# Patient Record
Sex: Male | Born: 1985 | Race: Black or African American | Hispanic: No | Marital: Single
Health system: Southern US, Community
[De-identification: ages and names within clinical notes are randomized; demographics above are authoritative.]

---

## 2020-02-08 ENCOUNTER — Ambulatory Visit (INDEPENDENT_AMBULATORY_CARE_PROVIDER_SITE_OTHER): Payer: 59 | Admitting: Family Medicine

## 2020-02-08 ENCOUNTER — Encounter: Payer: Self-pay | Admitting: Family Medicine

## 2020-02-08 ENCOUNTER — Other Ambulatory Visit: Payer: Self-pay

## 2020-02-08 ENCOUNTER — Ambulatory Visit: Payer: Self-pay

## 2020-02-08 VITALS — BP 120/84 | Ht 67.0 in | Wt 165.0 lb

## 2020-02-08 DIAGNOSIS — M25451 Effusion, right hip: Secondary | ICD-10-CM | POA: Insufficient documentation

## 2020-02-08 DIAGNOSIS — M87059 Idiopathic aseptic necrosis of unspecified femur: Secondary | ICD-10-CM | POA: Insufficient documentation

## 2020-02-08 DIAGNOSIS — M25551 Pain in right hip: Secondary | ICD-10-CM

## 2020-02-08 MED ORDER — PREDNISONE 5 MG PO TABS: ORAL_TABLET | ORAL | 0 refills | Status: AC

## 2020-02-08 NOTE — Patient Instructions (Signed)
Nice to meet you Please try the prednisone  Please try the crutches   Please send me a message in MyChart with any questions or updates.  Please see me back in 1-2 weeks.   --Dr. Jordan Likes

## 2020-02-08 NOTE — Assessment & Plan Note (Signed)
Has severe right hip pain.  Seems more likely related joint being the source as opposed to his back.  Has been ongoing intermittently for the past 6 months or so.  Possible for labral issue.  May have an inflammatory origin but no significant hyperemia on ultrasound today.  Previous x-rays were normal. -Counseled on home exercise therapy and supportive care. -Prednisone. -Crutches. -May need to consider physical therapy or injection or further imaging or lab work.

## 2020-02-08 NOTE — Progress Notes (Signed)
  Jorge Nelson - 35 y.o. male MRN [id number]  Date of birth: 06-28-1985  SUBJECTIVE:  Including CC & ROS.  No chief complaint on file.   Jorge Nelson is a 35 y.o. male that is presenting with acute on chronic right hip pain.  He has pain with any motion or ambulation.  Denies any specific injury.  He reports having various other joints being problematic from time to time.  No previous surgeries.  Seems to be severe.  No fevers or chills.   Review of Systems See HPI   HISTORY: Past Medical, Surgical, Social, and Family History Reviewed & Updated per EMR.   Pertinent Historical Findings include:  History reviewed. No pertinent past medical history.  History reviewed. No pertinent surgical history.  History reviewed. No pertinent family history.  Social History   Socioeconomic History  . Marital status: Single    Spouse name: Not on file  . Number of children: Not on file  . Years of education: Not on file  . Highest education level: Not on file  Occupational History  . Not on file  Tobacco Use  . Smoking status: Not on file  . Smokeless tobacco: Not on file  Substance and Sexual Activity  . Alcohol use: Not on file  . Drug use: Not on file  . Sexual activity: Not on file  Other Topics Concern  . Not on file  Social History Narrative  . Not on file   Social Determinants of Health   Financial Resource Strain: Not on file  Food Insecurity: Not on file  Transportation Needs: Not on file  Physical Activity: Not on file  Stress: Not on file  Social Connections: Not on file  Intimate Partner Violence: Not on file     PHYSICAL EXAM:  VS: BP 120/84   Ht 5\' 7"  (1.702 m)   Wt 165 lb (74.8 kg)   BMI 25.84 kg/m  Physical Exam Gen: NAD, alert, cooperative with exam, well-appearing MSK:  Right hip: Limited internal and external rotation. Limited hip flexion. There is a swelling over the lateral thigh. Neurovascular intact  Limited ultrasound: Right hip:  There  is an effusion of the hip joint. No specific changes of the musculature. Normal-appearing ASIS. No changes at the AIIS.  Summary: Findings would suggest effusion of the hip joint.  Ultrasound and interpretation by , MD    ASSESSMENT & PLAN:   Hip effusion, right Has severe right hip pain.  Seems more likely related joint being the source as opposed to his back.  Has been ongoing intermittently for the past 6 months or so.  Possible for labral issue.  May have an inflammatory origin but no significant hyperemia on ultrasound today.  Previous x-rays were normal. -Counseled on home exercise therapy and supportive care. -Prednisone. -Crutches. -May need to consider physical therapy or injection or further imaging or lab work.

## 2020-02-15 ENCOUNTER — Ambulatory Visit: Payer: Self-pay

## 2020-02-15 ENCOUNTER — Ambulatory Visit (HOSPITAL_BASED_OUTPATIENT_CLINIC_OR_DEPARTMENT_OTHER)
Admission: RE | Admit: 2020-02-15 | Discharge: 2020-02-15 | Disposition: A | Discharge status: Home / Self Care | Payer: 59 | Source: Ambulatory Visit | Attending: Family Medicine | Admitting: Family Medicine

## 2020-02-15 ENCOUNTER — Other Ambulatory Visit: Payer: Self-pay

## 2020-02-15 ENCOUNTER — Ambulatory Visit (INDEPENDENT_AMBULATORY_CARE_PROVIDER_SITE_OTHER): Payer: 59 | Admitting: Family Medicine

## 2020-02-15 ENCOUNTER — Ambulatory Visit: Payer: 59 | Admitting: Family Medicine

## 2020-02-15 VITALS — BP 130/86 | Ht 67.0 in | Wt 165.0 lb

## 2020-02-15 DIAGNOSIS — M25451 Effusion, right hip: Secondary | ICD-10-CM | POA: Insufficient documentation

## 2020-02-15 DIAGNOSIS — M542 Cervicalgia: Secondary | ICD-10-CM | POA: Diagnosis present

## 2020-02-15 MED ORDER — TRIAMCINOLONE ACETONIDE 40 MG/ML IJ SUSP
40.0000 mg | Freq: Once | INTRAMUSCULAR | Status: AC
  Administered 2020-02-15: 40 mg via INTRA_ARTICULAR

## 2020-02-15 NOTE — Patient Instructions (Signed)
Good to see you Please try ice as needed  I will call with the results from today   Please send me a message in MyChart with any questions or updates.  Please see me back in 4 weeks.   --Dr. Skylarr Liz  

## 2020-02-15 NOTE — Assessment & Plan Note (Signed)
Has limited range of motion looking laterally. -Counseled on home exercise therapy and supportive care. -X-ray.

## 2020-02-15 NOTE — Progress Notes (Signed)
Jorge Nelson - 35 y.o. male MRN [id number]  Date of birth: 1985-11-24  SUBJECTIVE:  Including CC & ROS.  No chief complaint on file.   Jorge Nelson is a 35 y.o. male that is presenting with acute worsening of his right hip and gluteus type pain.  I had limited improvement with prednisone for the first couple of days but the pain has returned.  Pain has been ongoing since last April.  He also has pain in other joints.  He has trouble with looking laterally to his right.  No specific injury or inciting event.  He has a nephew that has sickle cell trait.  Unclear if his sickle cell status.   Review of Systems See HPI   HISTORY: Past Medical, Surgical, Social, and Family History Reviewed & Updated per EMR.   Pertinent Historical Findings include:  No past medical history on file.  No past surgical history on file.  No family history on file.  Social History   Socioeconomic History  . Marital status: Single    Spouse name: Not on file  . Number of children: Not on file  . Years of education: Not on file  . Highest education level: Not on file  Occupational History  . Not on file  Tobacco Use  . Smoking status: Not on file  . Smokeless tobacco: Not on file  Substance and Sexual Activity  . Alcohol use: Not on file  . Drug use: Not on file  . Sexual activity: Not on file  Other Topics Concern  . Not on file  Social History Narrative  . Not on file   Social Determinants of Health   Financial Resource Strain: Not on file  Food Insecurity: Not on file  Transportation Needs: Not on file  Physical Activity: Not on file  Stress: Not on file  Social Connections: Not on file  Intimate Partner Violence: Not on file     PHYSICAL EXAM:  VS: BP 130/86   Ht 5\' 7"  (1.702 m)   Wt 165 lb (74.8 kg)   BMI 25.84 kg/m  Physical Exam Gen: NAD, alert, cooperative with exam, well-appearing MSK:  Right hip: Limited internal and external rotation. Limited hip  flexion. Tenderness to palpation at the joint. Neurovascular intact   Aspiration/Injection Procedure Note Thielen Mar 01, 1985  Procedure: Injection Indications: Right hip pain  Procedure Details Consent: Risks of procedure as well as the alternatives and risks of each were explained to the (patient/caregiver).  Consent for procedure obtained. Time Out: Verified patient identification, verified procedure, site/side was marked, verified correct patient position, special equipment/implants available, medications/allergies/relevent history reviewed, required imaging and test results available.  Performed.  The area was cleaned with iodine and alcohol swabs.    The Right hip joint was injected 5 cc of 1% lidocaine on a 22-gauge 3-1/2 inch needle.  The syringe was switched and a mixture containing 1 cc's of 40 mg Kenalog and 4 cc's of 0.25% bupivacaine was injected.  Ultrasound was used. Images were obtained in long views showing the injection.     A sterile dressing was applied.  Patient did tolerate procedure well.     ASSESSMENT & PLAN:   Hip effusion, right Effusion seems to be worse today.  Unclear if this is more inflammatory versus mechanical. -Counseled on home exercise therapy and supportive care. -Injection. -Sickle cell screen, sed rate, CRP, uric acid, and ANA panel, CK -Right hip x-ray and low back x-ray.  Neck pain Has limited range of  motion looking laterally. -Counseled on home exercise therapy and supportive care. -X-ray.

## 2020-02-15 NOTE — Assessment & Plan Note (Signed)
Effusion seems to be worse today.  Unclear if this is more inflammatory versus mechanical. -Counseled on home exercise therapy and supportive care. -Injection. -Sickle cell screen, sed rate, CRP, uric acid, and ANA panel, CK -Right hip x-ray and low back x-ray.

## 2020-02-18 ENCOUNTER — Other Ambulatory Visit: Payer: Self-pay | Admitting: *Deleted

## 2020-02-18 ENCOUNTER — Telehealth: Payer: Self-pay | Admitting: Family Medicine

## 2020-02-18 DIAGNOSIS — M458 Ankylosing spondylitis sacral and sacrococcygeal region: Secondary | ICD-10-CM

## 2020-02-18 NOTE — Telephone Encounter (Signed)
Left VM for patient. If he calls back please have him speak with a nurse/CMA and inform that his xrays and lab work are suggestive of ankylosing spondylitis.  I will place a lab to have confirmation.  We will also place referral to rheumatology..   If any questions then please take the best time and phone number to call and I will try to call him back.   Myra Rude, MD [facility] 02/18/2020, 8:35 AM

## 2020-02-19 ENCOUNTER — Encounter: Payer: Self-pay | Admitting: Family Medicine

## 2020-02-19 LAB — SEDIMENTATION RATE: Sed Rate: 26 mm/hr — ABNORMAL HIGH (ref 0–15)

## 2020-02-19 LAB — ANA,IFA RA DIAG PNL W/RFLX TIT/PATN
ANA Titer 1: NEGATIVE
Cyclic Citrullin Peptide Ab: 9 units (ref 0–19)
Rheumatoid fact SerPl-aCnc: <10 IU/mL (ref <14.0)

## 2020-02-19 LAB — URIC ACID: Uric Acid: 4.9 mg/dL (ref 3.8–8.4)

## 2020-02-19 LAB — SICKLE CELL SCREEN: Sickle Cell Screen: NEGATIVE

## 2020-02-19 LAB — CK: Total CK: 99 U/L (ref 49–439)

## 2020-02-19 LAB — C-REACTIVE PROTEIN: CRP: 46 mg/L — ABNORMAL HIGH (ref 0–10)

## 2020-02-21 ENCOUNTER — Ambulatory Visit: Payer: 59 | Admitting: Family Medicine

## 2020-02-22 ENCOUNTER — Encounter: Payer: Self-pay | Admitting: *Deleted

## 2020-02-22 ENCOUNTER — Other Ambulatory Visit: Payer: Self-pay | Admitting: Family Medicine

## 2020-02-22 DIAGNOSIS — M458 Ankylosing spondylitis sacral and sacrococcygeal region: Secondary | ICD-10-CM

## 2020-02-22 NOTE — Progress Notes (Signed)
Medication Samples have been provided to the patient.  Drug name: rayos       Strength: 5mg         Qty: 2 boxes  LOT: B  Exp.Date: 7/22  Dosing instructions: take one at 10pm and can take 2 only if 1 does not help after several nights  The patient has been instructed regarding the correct time, dose, and frequency of taking this medication, including desired effects and most common side effects.   8/22 8:49 AM 02/22/2020

## 2020-02-22 NOTE — Patient Instructions (Signed)
Samples given per Dr Jordan Likes

## 2020-02-22 NOTE — Progress Notes (Signed)
Evaluate other labs for ongoing inflammatory changes.   Myra Rude, MD [facility] 02/22/2020, 8:18 AM

## 2020-02-23 LAB — COMPREHENSIVE METABOLIC PANEL
ALT: 51 IU/L — ABNORMAL HIGH (ref 0–44)
AST: 25 IU/L (ref 0–40)
Albumin/Globulin Ratio: 1.6 (ref 1.2–2.2)
Albumin: 4.6 g/dL (ref 4.0–5.0)
Alkaline Phosphatase: 139 IU/L — ABNORMAL HIGH (ref 44–121)
BUN/Creatinine Ratio: 14 (ref 9–20)
BUN: 12 mg/dL (ref 6–20)
Bilirubin Total: 0.4 mg/dL (ref 0.0–1.2)
CO2: 23 mmol/L (ref 20–29)
Calcium: 10 mg/dL (ref 8.7–10.2)
Chloride: 99 mmol/L (ref 96–106)
Creatinine, Ser: 0.87 mg/dL (ref 0.76–1.27)
GFR calc Af Amer: 131 mL/min/{1.73_m2} (ref >59)
GFR calc non Af Amer: 113 mL/min/{1.73_m2} (ref >59)
Globulin, Total: 2.9 g/dL (ref 1.5–4.5)
Glucose: 106 mg/dL — ABNORMAL HIGH (ref 65–99)
Potassium: 4.9 mmol/L (ref 3.5–5.2)
Sodium: 138 mmol/L (ref 134–144)
Total Protein: 7.5 g/dL (ref 6.0–8.5)

## 2020-02-23 LAB — CBC
Hematocrit: 49.7 % (ref 37.5–51.0)
Hemoglobin: 16.1 g/dL (ref 13.0–17.7)
MCH: 26.6 pg (ref 26.6–33.0)
MCHC: 32.4 g/dL (ref 31.5–35.7)
MCV: 82 fL (ref 79–97)
Platelets: 424 10*3/uL (ref 150–450)
RBC: 6.06 x10E6/uL — ABNORMAL HIGH (ref 4.14–5.80)
RDW: 14.5 % (ref 11.6–15.4)
WBC: 12.3 10*3/uL — ABNORMAL HIGH (ref 3.4–10.8)

## 2020-02-23 LAB — HEMOGLOBIN A1C
Est. average glucose Bld gHb Est-mCnc: 114 mg/dL
Hgb A1c MFr Bld: 5.6 % (ref 4.8–5.6)

## 2020-02-23 LAB — TSH: TSH: 2.88 u[IU]/mL (ref 0.450–4.500)

## 2020-02-26 ENCOUNTER — Telehealth: Payer: Self-pay | Admitting: Family Medicine

## 2020-02-26 NOTE — Telephone Encounter (Signed)
Left VM for patient. If he calls back please have him speak with a nurse/CMA and inform that his lab work was reassuring. No diabetes. He had one liver enzyme elevated so we will need to check this again in 6 months.   If any questions then please take the best time and phone number to call and I will try to call him back.   Myra Rude, MD [facility] 02/26/2020, 8:53 AM

## 2020-02-26 NOTE — Telephone Encounter (Signed)
Rcvd notice from [facility] -[address],[place] --Appt scheduled w/ Dr. Azucena Fallen Tues--03/04/20 @ 8:30 am.  --Jorge Nelson.  --glh

## 2020-02-28 ENCOUNTER — Encounter: Payer: Self-pay | Admitting: Family Medicine

## 2020-03-05 ENCOUNTER — Other Ambulatory Visit: Payer: Self-pay | Admitting: Family Medicine

## 2020-03-05 ENCOUNTER — Encounter: Payer: Self-pay | Admitting: *Deleted

## 2020-03-05 DIAGNOSIS — M87051 Idiopathic aseptic necrosis of right femur: Secondary | ICD-10-CM

## 2020-03-05 NOTE — Progress Notes (Signed)
Proceed with MRI of right hip due to avascular necrosis on xray and tried conservative treatment and injection.   Myra Rude, MD [facility] 03/05/2020, 8:36 AM

## 2020-03-05 NOTE — Addendum Note (Signed)
Addended by: Merrilyn Puma on: 03/05/2020 10:17 AM   Modules accepted: Orders

## 2020-03-05 NOTE — Addendum Note (Signed)
Addended by: Merrilyn Puma on: 03/05/2020 11:19 AM   Modules accepted: Orders

## 2020-03-06 ENCOUNTER — Encounter: Payer: Self-pay | Admitting: *Deleted

## 2020-03-10 ENCOUNTER — Encounter: Payer: Self-pay | Admitting: *Deleted

## 2020-03-14 ENCOUNTER — Telehealth (INDEPENDENT_AMBULATORY_CARE_PROVIDER_SITE_OTHER): Payer: 59 | Admitting: Family Medicine

## 2020-03-14 ENCOUNTER — Other Ambulatory Visit: Payer: Self-pay

## 2020-03-14 DIAGNOSIS — M454 Ankylosing spondylitis of thoracic region: Secondary | ICD-10-CM | POA: Insufficient documentation

## 2020-03-14 DIAGNOSIS — M458 Ankylosing spondylitis sacral and sacrococcygeal region: Secondary | ICD-10-CM | POA: Diagnosis not present

## 2020-03-14 DIAGNOSIS — M546 Pain in thoracic spine: Secondary | ICD-10-CM | POA: Diagnosis not present

## 2020-03-14 DIAGNOSIS — M542 Cervicalgia: Secondary | ICD-10-CM

## 2020-03-14 NOTE — Assessment & Plan Note (Signed)
Acute on chronic in nature.  May be associated with his underlying inflammatory changes. -Counseled on home exercise therapy and supportive care. -X-ray.

## 2020-03-14 NOTE — Assessment & Plan Note (Signed)
Has been to rheumatology and is on Humira. -May need more samples of Rayos

## 2020-03-14 NOTE — Progress Notes (Signed)
Virtual Visit via Video Note  I connected with Angola Tobler on 03/14/20 at  8:10 AM EST by a video enabled telemedicine application and verified that I am speaking with the correct person using two identifiers.  Location: Patient: home Provider: office   I discussed the limitations of evaluation and management by telemedicine and the availability of in person appointments. The patient expressed understanding and agreed to proceed.  History of Present Illness:  Mr. Scheibel is a 35 year old male that is following up for his right hip.  He has been to the rheumatologist and started on Humira.  He is also having neck and back pain.  These are acute on chronic in nature.   Observations/Objective:  Gen: NAD, alert, cooperative with exam, well-appearing   Assessment and Plan:  Neck pain: Acute on chronic in nature.  May be associated with his underlying inflammatory changes. -Counseled on home exercise therapy and supportive care. -X-ray.  Thoracic back pain: Acute on chronic in nature.  Pain is associated with ankylosing spondylitis. -Counseled on home exercise therapy and supportive care. -X-ray  Ankylosing spondylitis Has been to rheumatology and is on Humira. -May need more samples of Rayos  Follow Up Instructions:    I discussed the assessment and treatment plan with the patient. The patient was provided an opportunity to ask questions and all were answered. The patient agreed with the plan and demonstrated an understanding of the instructions.   The patient was advised to call back or seek an in-person evaluation if the symptoms worsen or if the condition fails to improve as anticipated.   Clare Gandy, MD

## 2020-03-14 NOTE — Assessment & Plan Note (Signed)
Acute on chronic in nature.  Pain is associated with ankylosing spondylitis. -Counseled on home exercise therapy and supportive care. -X-ray

## 2020-03-17 ENCOUNTER — Ambulatory Visit (INDEPENDENT_AMBULATORY_CARE_PROVIDER_SITE_OTHER): Payer: 59 | Admitting: Sports Medicine

## 2020-03-17 ENCOUNTER — Ambulatory Visit (INDEPENDENT_AMBULATORY_CARE_PROVIDER_SITE_OTHER): Payer: 59

## 2020-03-17 ENCOUNTER — Other Ambulatory Visit: Payer: Self-pay

## 2020-03-17 DIAGNOSIS — M87051 Idiopathic aseptic necrosis of right femur: Secondary | ICD-10-CM

## 2020-03-17 DIAGNOSIS — M169 Osteoarthritis of hip, unspecified: Secondary | ICD-10-CM | POA: Diagnosis not present

## 2020-03-17 DIAGNOSIS — M2469 Ankylosis, other specified joint: Secondary | ICD-10-CM | POA: Diagnosis not present

## 2020-03-17 MED ORDER — TRAMADOL HCL 50 MG PO TABS: 50.0000 mg | ORAL_TABLET | Freq: Three times a day (TID) | ORAL | 0 refills | Status: DC | PRN

## 2020-03-17 MED ORDER — GADOBUTROL 1 MMOL/ML IV SOLN
1.0000 mL | Freq: Once | INTRAVENOUS | Status: AC | PRN
  Administered 2020-03-17: 1 mL via INTRAVENOUS

## 2020-03-17 NOTE — Assessment & Plan Note (Signed)
This is a very pleasant 35 year old male with history of ankylosing spondylitis, he has had a long history of severe right hip pain, seen by Dr. Jordan Likes and diagnosed with hip OA and possible avascular necrosis. No strong history of steroid use, alcohol use, hip trauma, or smoking. He is referred to me for hip arthrogram, we performed an arthrogram injection today, adding some tramadol for his pain, I did explain to him that he is really going to need a hip replacement.

## 2020-03-17 NOTE — Progress Notes (Signed)
    Procedures performed today:    Procedure: Real-time Ultrasound Guided gadolinium contrast injection of right hip joint Device: Samsung HS60  Verbal informed consent obtained.  Time-out conducted.  Noted no overlying erythema, induration, or other signs of local infection.  Skin prepped in a sterile fashion.  Local anesthesia: Topical Ethyl chloride.  With sterile technique and under real time ultrasound guidance: Noted dysplastic and arthritic appearing hip joint, 22-gauge spinal needle contacted femoral head/neck junction, I injected 1 cc kenalog 40, 2 cc lidocaine, 2 cc bupivacaine, syringe switched and 0.1 cc gadolinium injected, syringe again switched and 15 cc sterile saline used to fully distend the joint. Joint visualized and capsule seen distending confirming intra-articular placement of contrast material and medication. Completed without difficulty  Advised to call if fevers/chills, erythema, induration, drainage, or persistent bleeding.  Images permanently stored in PACS Impression: Technically successful ultrasound guided gadolinium contrast injection for MR arthrography.  Please see separate MR arthrogram report.   Independent interpretation of notes and tests performed by another provider:   None.  Brief History, Exam, Impression, and Recommendations:    Avascular necrosis of bone of hip (HCC) This is a very pleasant 35 year old male with history of ankylosing spondylitis, he has had a long history of severe right hip pain, seen by Dr. Jordan Likes and diagnosed with hip OA and possible avascular necrosis. No strong history of steroid use, alcohol use, hip trauma, or smoking. He is referred to me for hip arthrogram, we performed an arthrogram injection today, adding some tramadol for his pain, I did explain to him that he is really going to need a hip replacement.     ___________________________________________ Jorge Nelson. Jorge Nelson, M.D., ABFM., CAQSM. Primary Care  and Sports Medicine [facility]  Adjunct Instructor of Family Medicine  [employer]

## 2020-03-18 ENCOUNTER — Encounter: Payer: Self-pay | Admitting: Family Medicine

## 2020-03-19 NOTE — Telephone Encounter (Signed)
Routing to primary treating provider.  My role is just to do the injection here.

## 2020-03-20 ENCOUNTER — Telehealth (INDEPENDENT_AMBULATORY_CARE_PROVIDER_SITE_OTHER): Payer: 59 | Admitting: Family Medicine

## 2020-03-20 ENCOUNTER — Other Ambulatory Visit: Payer: Self-pay

## 2020-03-20 ENCOUNTER — Encounter: Payer: Self-pay | Admitting: *Deleted

## 2020-03-20 DIAGNOSIS — M87051 Idiopathic aseptic necrosis of right femur: Secondary | ICD-10-CM

## 2020-03-20 NOTE — Progress Notes (Signed)
Virtual Visit via Video Note  I connected with Angola Rudy on 03/20/20 at  8:00 AM EST by a video enabled telemedicine application and verified that I am speaking with the correct person using two identifiers.  Location: Patient: home Provider: office   I discussed the limitations of evaluation and management by telemedicine and the availability of in person appointments. The patient expressed understanding and agreed to proceed.  History of Present Illness:  Mr. Stepanek is a 35 year old male is following up after the MRI of his right hip.  It was demonstrating advanced joint arthritis as well as labral degeneration.  Has impingement as well as synovitis.   Observations/Objective:  Gen: NAD, alert, cooperative with exam, well-appearing  Assessment and Plan:  Avascular necrosis of right hip joint. MRI was confirming cellulitis as well as significant degenerative changes of the labrum and the joint. -Counseled on home exercise therapy and supportive care. -Referral to orthopedic surgery with Dr. Caswell Corwin  Follow Up Instructions:    I discussed the assessment and treatment plan with the patient. The patient was provided an opportunity to ask questions and all were answered. The patient agreed with the plan and demonstrated an understanding of the instructions.   The patient was advised to call back or seek an in-person evaluation if the symptoms worsen or if the condition fails to improve as anticipated.    Clare Gandy, MD

## 2020-03-20 NOTE — Assessment & Plan Note (Signed)
MRI was confirming cellulitis as well as significant degenerative changes of the labrum and the joint. -Counseled on home exercise therapy and supportive care. -Referral to orthopedic surgery with Dr. Caswell Corwin

## 2020-03-24 ENCOUNTER — Ambulatory Visit (HOSPITAL_BASED_OUTPATIENT_CLINIC_OR_DEPARTMENT_OTHER)
Admission: RE | Admit: 2020-03-24 | Discharge: 2020-03-24 | Disposition: A | Discharge status: Home / Self Care | Payer: 59 | Source: Ambulatory Visit | Attending: Family Medicine | Admitting: Family Medicine

## 2020-03-24 ENCOUNTER — Other Ambulatory Visit: Payer: Self-pay

## 2020-03-24 ENCOUNTER — Other Ambulatory Visit: Payer: Self-pay | Admitting: Family Medicine

## 2020-03-24 DIAGNOSIS — M546 Pain in thoracic spine: Secondary | ICD-10-CM | POA: Diagnosis present

## 2020-03-24 DIAGNOSIS — M542 Cervicalgia: Secondary | ICD-10-CM

## 2020-03-24 DIAGNOSIS — M453 Ankylosing spondylitis of cervicothoracic region: Secondary | ICD-10-CM

## 2020-03-24 DIAGNOSIS — M458 Ankylosing spondylitis sacral and sacrococcygeal region: Secondary | ICD-10-CM | POA: Diagnosis present

## 2020-03-26 ENCOUNTER — Telehealth: Payer: Self-pay | Admitting: Family Medicine

## 2020-03-26 DIAGNOSIS — M542 Cervicalgia: Secondary | ICD-10-CM

## 2020-03-26 DIAGNOSIS — M458 Ankylosing spondylitis sacral and sacrococcygeal region: Secondary | ICD-10-CM

## 2020-03-26 DIAGNOSIS — M87051 Idiopathic aseptic necrosis of right femur: Secondary | ICD-10-CM

## 2020-03-26 DIAGNOSIS — M546 Pain in thoracic spine: Secondary | ICD-10-CM

## 2020-03-26 NOTE — Telephone Encounter (Signed)
Left VM for patient. If he calls back please have him speak with a nurse/CMA and inform that his xray is showing loss the height of the bone in his back. We may need to check his vitamin d going forward. If the pain in the back and neck are still ongoing despite treatment for his inflammatory condition, we may need to pursue an MRI in each of these areas.    If any questions then please take the best time and phone number to call and I will try to call him back.   Myra Rude, MD [facility] 03/26/2020, 8:17 AM

## 2020-03-26 NOTE — Addendum Note (Signed)
Addended by: Merrilyn Puma on: 03/26/2020 09:17 AM   Modules accepted: Orders

## 2020-03-26 NOTE — Telephone Encounter (Signed)
Per Dr. Jordan Likes MRI orders placed and [facility] referral entered.

## 2020-03-26 NOTE — Telephone Encounter (Signed)
Pt informed of below. He would like to proceed with MRIs for neck and back.   He states [facility] is in network with his insurance.  See attached referral for his hip.

## 2020-03-28 ENCOUNTER — Other Ambulatory Visit: Payer: 59

## 2020-03-31 ENCOUNTER — Ambulatory Visit (INDEPENDENT_AMBULATORY_CARE_PROVIDER_SITE_OTHER): Payer: 59 | Admitting: Orthopaedic Surgery

## 2020-03-31 VITALS — Ht 67.0 in | Wt 165.0 lb

## 2020-03-31 DIAGNOSIS — M1611 Unilateral primary osteoarthritis, right hip: Secondary | ICD-10-CM

## 2020-03-31 NOTE — Progress Notes (Signed)
Office Visit Note   Patient: Jorge Nelson           Date of Birth: Oct 15, 1985           MRN: [id number] Visit Date: 03/31/2020              Requested by: Myra Rude, MD [address]  [place] [address] PCP: Patient, No Pcp Per   Assessment & Plan: Visit Diagnoses:  1. Unilateral primary osteoarthritis, right hip     Plan: I went over the patient's x-rays with him in detail.  He has severe end-stage arthritis of his right hip.  I have recommended hip replacement for his right hip.  There is really no other treatment measures that can help with this standpoint.  Even an intra-articular steroid injection would only help temporize the pain for a small amount of time.  He is already tried steroids.  I recommended he offload that hip with at least a cane.  I showed him a hip model explained in detail what hip replacement surgery involves.  We discussed the risks and benefits of this type of surgery as well as what to expect through an intraoperative and postoperative course.  I can my hand about hip replacement surgery as well.  All questions and concerns were answered and addressed.  He is interested in having this scheduled.  We will be in touch.  Follow-Up Instructions: Return for 2 weeks post-op.   Orders:  No orders of the defined types were placed in this encounter.  No orders of the defined types were placed in this encounter.     Procedures: No procedures performed   Clinical Data: No additional findings.   Subjective: Chief Complaint  Patient presents with  . Right Hip - Pain  The patient comes in today with over a year of worsening right hip pain and stiffness.  He has x-rays and a MRI of his right hip on the canopy system for me to review.  He does walk with a limp he says is painful to walk with pain in the groin on the right side and pain with weightbearing and motion of the right hip.  He says it is harder to put his shoes and socks on  on the right side as well.  He denies any injuries.  He is a thin individual.  He has no active medical issues other than he says chronic low back pain and a stiff back.  I believe there is potential for diagnosis of ankylosing spondylitis from reviewing his chart.  He is worked on activity modification and taken anti-inflammatories.  Has been on steroids as well.  All of that his only minimally decreased his right hip pain.  HPI  Review of Systems He currently denies any headache, chest pain, short of breath, fever, chills, nausea, vomiting  Objective: Vital Signs: Ht 5\' 7"  (1.702 m)   Wt 165 lb (74.8 kg)   BMI 25.84 kg/m   Physical Exam He is alert and orient x3 and in no acute distress.  He walks with a significant limp and does show stiffness in his low back and his right hip. Ortho Exam Examination of his left hip is normal.  Examination of his right hip shows severe stiffness with internal and external rotation as well as significant pain in the groin with attempts of rotation of the right hip. Specialty Comments:  No specialty comments available.  Imaging: No results found. Both plain  films and the MRI of his right hip show severe end-stage arthritis.  There is no evidence of avascular process or fracture but there is flattening of the femoral head.  There is reactive edema in the acetabulum.  There are periarticular osteophytes and a large area denuding of the cartilage over the weightbearing surface of his right hip.  PMFS History: Patient Active Problem List   Diagnosis Date Noted  . Unilateral primary osteoarthritis, right hip 03/31/2020  . Ankylosing spondylitis of sacral region (HCC) 03/14/2020  . Acute midline thoracic back pain 03/14/2020  . Neck pain 02/15/2020  . Avascular necrosis of bone of hip (HCC) 02/08/2020   No past medical history on file.  No family history on file.  No past surgical history on file. Social History   Occupational History  . Not on file   Tobacco Use  . Smoking status: Not on file  . Smokeless tobacco: Not on file  Substance and Sexual Activity  . Alcohol use: Not on file  . Drug use: Not on file  . Sexual activity: Not on file

## 2020-04-01 ENCOUNTER — Encounter: Payer: Self-pay | Admitting: *Deleted

## 2020-04-06 ENCOUNTER — Other Ambulatory Visit: Payer: Self-pay

## 2020-04-06 ENCOUNTER — Ambulatory Visit (INDEPENDENT_AMBULATORY_CARE_PROVIDER_SITE_OTHER): Payer: 59

## 2020-04-06 DIAGNOSIS — M458 Ankylosing spondylitis sacral and sacrococcygeal region: Secondary | ICD-10-CM

## 2020-04-06 DIAGNOSIS — M546 Pain in thoracic spine: Secondary | ICD-10-CM

## 2020-04-06 DIAGNOSIS — M542 Cervicalgia: Secondary | ICD-10-CM | POA: Diagnosis not present

## 2020-04-06 DIAGNOSIS — M549 Dorsalgia, unspecified: Secondary | ICD-10-CM

## 2020-04-06 DIAGNOSIS — M25512 Pain in left shoulder: Secondary | ICD-10-CM

## 2020-04-06 DIAGNOSIS — M25511 Pain in right shoulder: Secondary | ICD-10-CM

## 2020-04-09 ENCOUNTER — Other Ambulatory Visit: Payer: Self-pay

## 2020-04-09 DIAGNOSIS — M87051 Idiopathic aseptic necrosis of right femur: Secondary | ICD-10-CM

## 2020-04-10 ENCOUNTER — Encounter: Payer: Self-pay | Admitting: Family Medicine

## 2020-04-10 ENCOUNTER — Encounter: Payer: Self-pay | Admitting: Orthopaedic Surgery

## 2020-04-10 ENCOUNTER — Other Ambulatory Visit: Payer: Self-pay | Admitting: Physician Assistant

## 2020-04-10 DIAGNOSIS — M87051 Idiopathic aseptic necrosis of right femur: Secondary | ICD-10-CM

## 2020-04-10 MED ORDER — TRAMADOL HCL 50 MG PO TABS: 50.0000 mg | ORAL_TABLET | Freq: Three times a day (TID) | ORAL | 0 refills | Status: DC | PRN

## 2020-04-11 ENCOUNTER — Telehealth (INDEPENDENT_AMBULATORY_CARE_PROVIDER_SITE_OTHER): Payer: 59 | Admitting: Family Medicine

## 2020-04-11 ENCOUNTER — Other Ambulatory Visit: Payer: Self-pay

## 2020-04-11 DIAGNOSIS — M454 Ankylosing spondylitis of thoracic region: Secondary | ICD-10-CM

## 2020-04-11 DIAGNOSIS — M87051 Idiopathic aseptic necrosis of right femur: Secondary | ICD-10-CM | POA: Diagnosis not present

## 2020-04-11 MED ORDER — CELECOXIB 200 MG PO CAPS: ORAL_CAPSULE | ORAL | 2 refills | Status: AC

## 2020-04-11 MED ORDER — TRAMADOL HCL 50 MG PO TABS: 50.0000 mg | ORAL_TABLET | Freq: Three times a day (TID) | ORAL | 0 refills | Status: DC | PRN

## 2020-04-11 NOTE — Assessment & Plan Note (Signed)
Has met with surgeon and is expected to have surgery in the next few months.  He does get improvement with tramadol for his pain. -Counseled on home exercise therapy and supportive care. -Tramadol.

## 2020-04-11 NOTE — Progress Notes (Signed)
Virtual Visit via Video Note  I connected with Jorge Nelson on 04/11/20 at  8:10 AM EDT by a video enabled telemedicine application and verified that I am speaking with the correct person using two identifiers.  Location: Patient: home Provider: office   I discussed the limitations of evaluation and management by telemedicine and the availability of in person appointments. The patient expressed understanding and agreed to proceed.  History of Present Illness:  Jorge Nelson is a 35 yo male that is following up after the MRI of his thoracic spine and cervical spine.  This was demonstrating ankylosing thoracic spine with marrow edema related to the inflammatory disease.   Observations/Objective:  Gen: NAD, alert, cooperative with exam, well-appearing   Assessment and Plan:  Avascular necrosis of right hip: Has met with surgeon and is expected to have surgery in the next few months.  He does get improvement with tramadol for his pain. -Counseled on home exercise therapy and supportive care. -Tramadol.  Ankylosing spondylolysis of thoracic region: Has changes on MRI consistent with ankylosing.  No nerve impingement or spinal stenosis. -Counseled on home exercise therapy and supportive care. -Celebrex  Follow Up Instructions:    I discussed the assessment and treatment plan with the patient. The patient was provided an opportunity to ask questions and all were answered. The patient agreed with the plan and demonstrated an understanding of the instructions.   The patient was advised to call back or seek an in-person evaluation if the symptoms worsen or if the condition fails to improve as anticipated.    Clearance Coots, MD

## 2020-04-11 NOTE — Assessment & Plan Note (Signed)
Has changes on MRI consistent with ankylosing.  No nerve impingement or spinal stenosis. -Counseled on home exercise therapy and supportive care. -Celebrex

## 2020-05-16 ENCOUNTER — Encounter: Payer: Self-pay | Admitting: Family Medicine

## 2020-05-20 ENCOUNTER — Other Ambulatory Visit: Payer: Self-pay | Admitting: Family Medicine

## 2020-05-21 MED ORDER — TRAMADOL HCL 50 MG PO TABS: 50.0000 mg | ORAL_TABLET | Freq: Three times a day (TID) | ORAL | 0 refills | Status: AC | PRN

## 2020-05-29 ENCOUNTER — Encounter: Payer: Self-pay | Admitting: Orthopaedic Surgery

## 2020-05-29 NOTE — Telephone Encounter (Signed)
Please call pt back.  Thank you!

## 2020-06-20 ENCOUNTER — Other Ambulatory Visit: Payer: Self-pay

## 2020-06-26 ENCOUNTER — Telehealth: Payer: Self-pay

## 2020-06-26 NOTE — Telephone Encounter (Signed)
Patient called and left voice mail stating he is moving to [place].  Cancelled [facility] for 07/15/20.  He will follow up with ortho there and have surgery.

## 2020-07-17 ENCOUNTER — Telehealth: Payer: Self-pay | Admitting: Orthopaedic Surgery

## 2020-07-17 NOTE — Telephone Encounter (Signed)
Received vm from pt, needs records as he has moved to [place]. IC,lmvm advised need release form filled out, advised we can send to him,advised for him to call us back to let us know if to mail to him or email to him and we will get form to him so we can process his request.

## 2020-07-21 ENCOUNTER — Telehealth: Payer: Self-pay | Admitting: Physician Assistant

## 2020-07-21 ENCOUNTER — Other Ambulatory Visit: Payer: Self-pay | Admitting: Family Medicine

## 2020-07-21 NOTE — Telephone Encounter (Signed)
Received vm from pt with email to send release form, emailed to him [email or web address]

## 2020-07-29 ENCOUNTER — Encounter: Payer: 59 | Admitting: Orthopaedic Surgery

## 2021-10-21 IMAGING — DX DG HIP (WITH OR WITHOUT PELVIS) 2-3V*R*
3 series · 3 of 3 positions shown · non-contrast
Comparison: None.
COMPARISON: None.

Addendum:
CLINICAL DATA: Chronic right hip pain.

EXAM:
DG HIP (WITH OR WITHOUT PELVIS) 2-3V RIGHT

[pelvis ap]
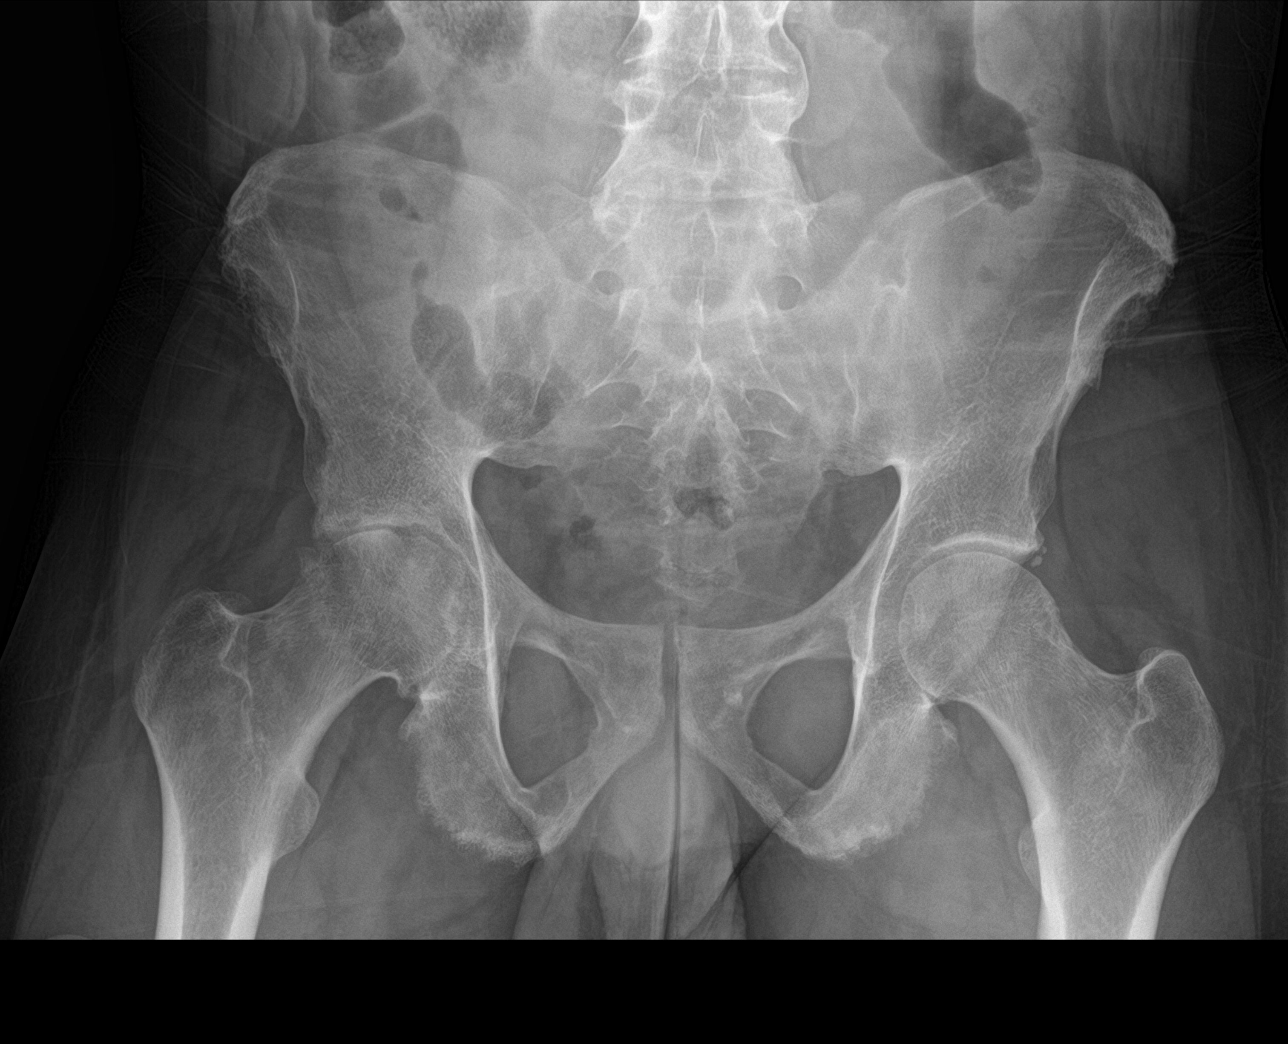

[hip ap]
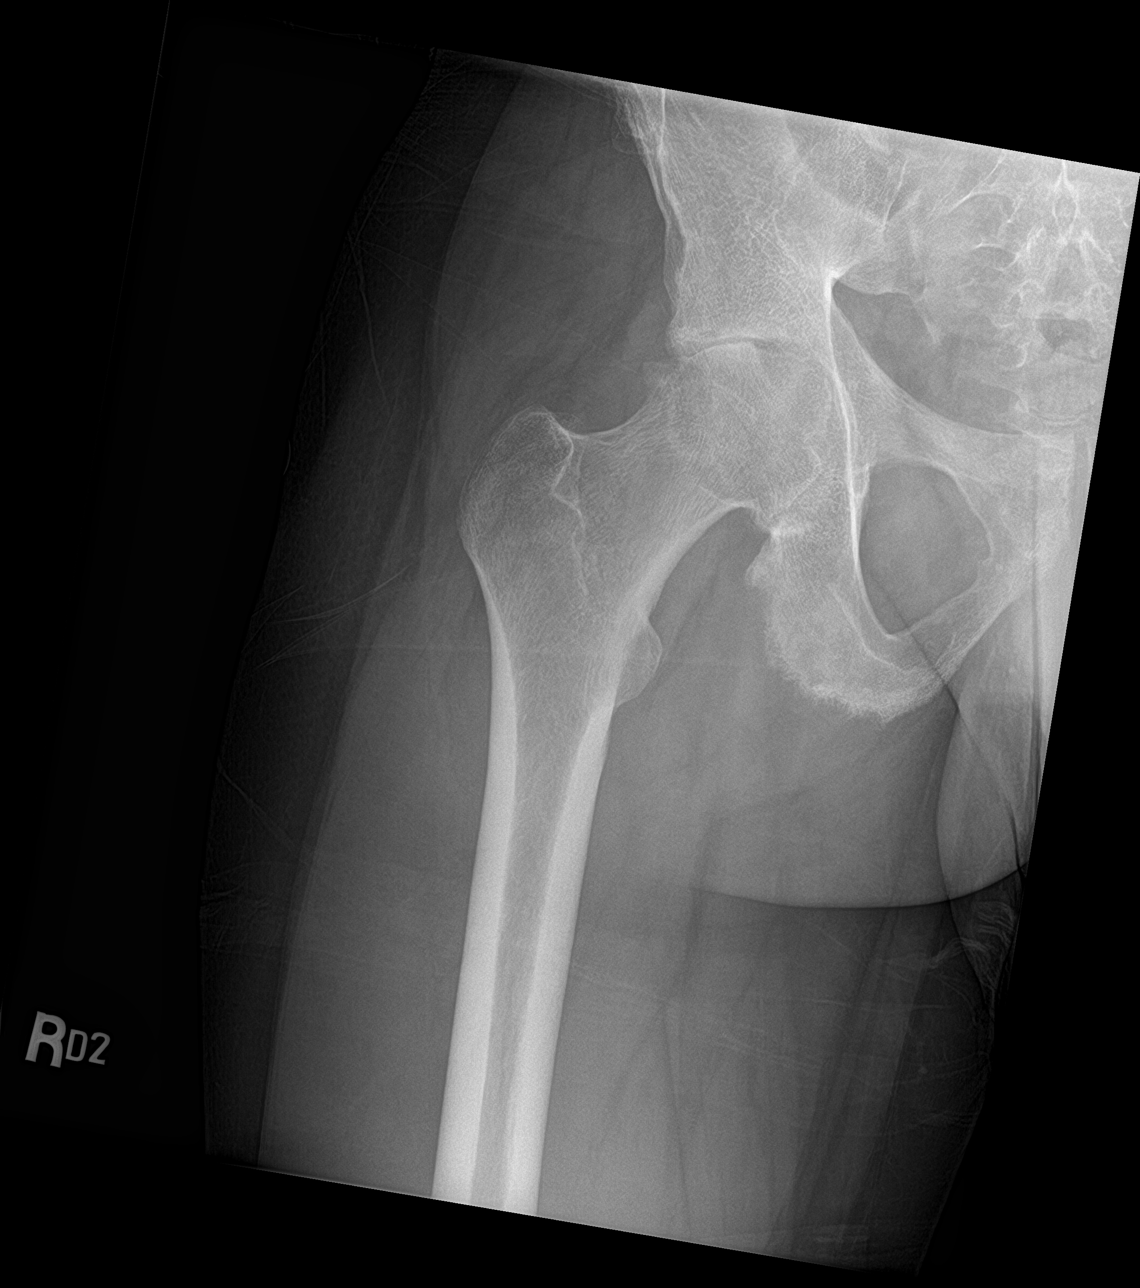

[hip lat]
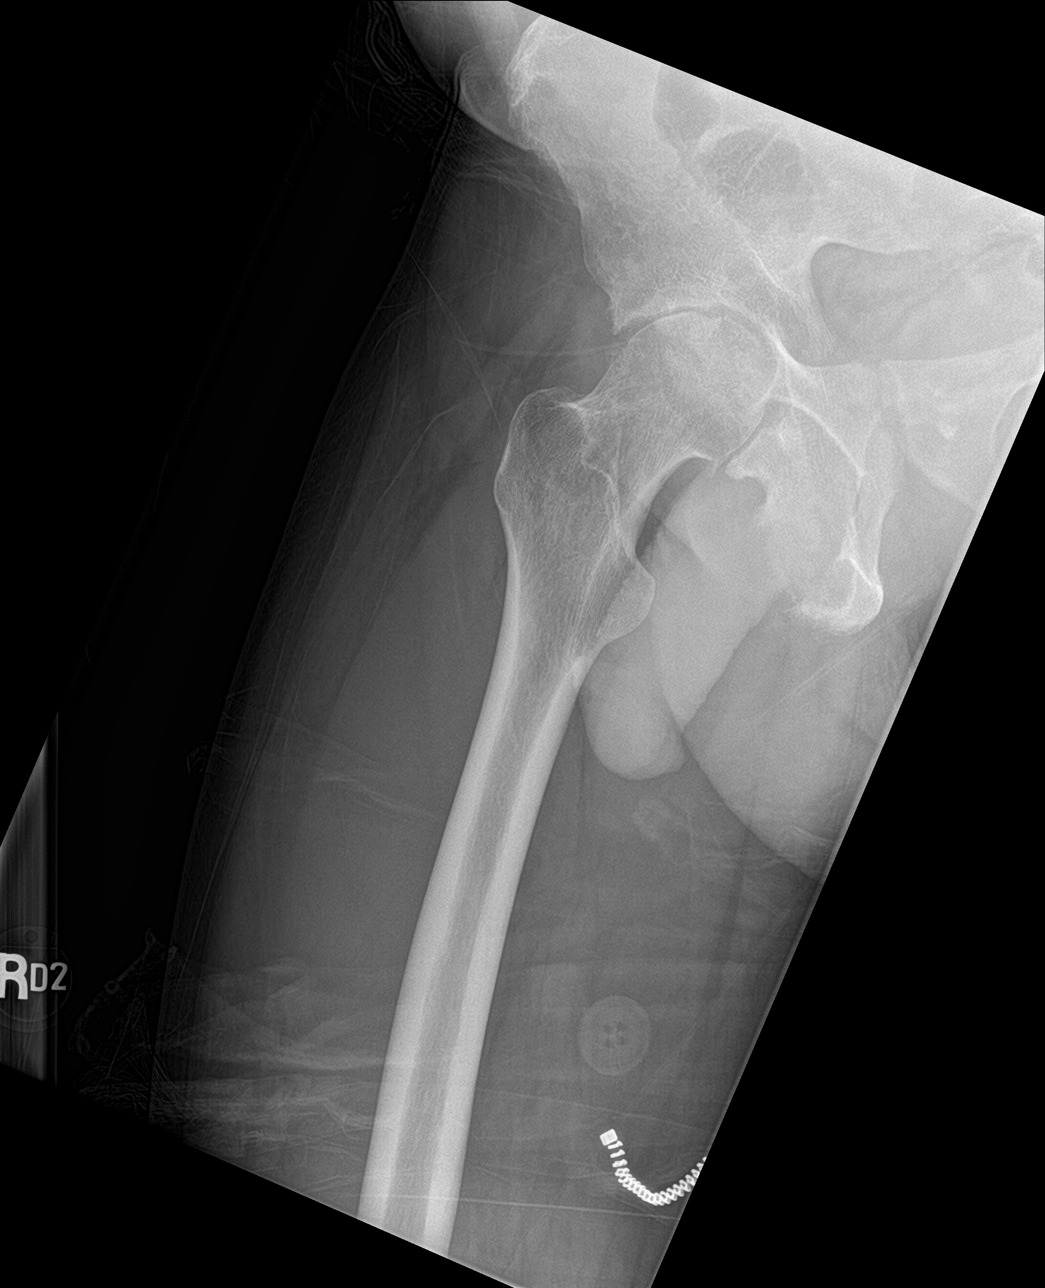

[3 of 3 positions shown; findings below may reference images not displayed]

FINDINGS: There is no evidence of hip fracture or dislocation. Moderate
narrowing and osteophyte formation is seen involving the right hip
joint. Focal defect is seen involving the superior portion of the
right femoral head suggesting avascular necrosis.
IMPRESSION: Moderate degenerative joint disease of the right hip. Probable
avascular necrosis of right femoral head. No acute abnormality is
noted.

ADDENDUM:
Upon further review, there does appear to be fusion of the
sacroiliac joints bilaterally, suggesting ankylosing spondylitis.

*** End of Addendum ***
FINDINGS: There is no evidence of hip fracture or dislocation. Moderate
narrowing and osteophyte formation is seen involving the right hip
joint. Focal defect is seen involving the superior portion of the
right femoral head suggesting avascular necrosis.
IMPRESSION: Moderate degenerative joint disease of the right hip. Probable
avascular necrosis of right femoral head. No acute abnormality is
noted.

## 2021-11-28 IMAGING — DX DG CERVICAL SPINE 2 OR 3 VIEWS
4 series · 4 of 4 positions shown · non-contrast
Comparison: Cervical March 24, 2020.  Thoracic February 15, 2020.

CLINICAL DATA: Upper back pain.  Acute.

EXAM:
CERVICAL SPINE - 2-3 VIEW; THORACIC SPINE 2 VIEWS

[c-spine lat]
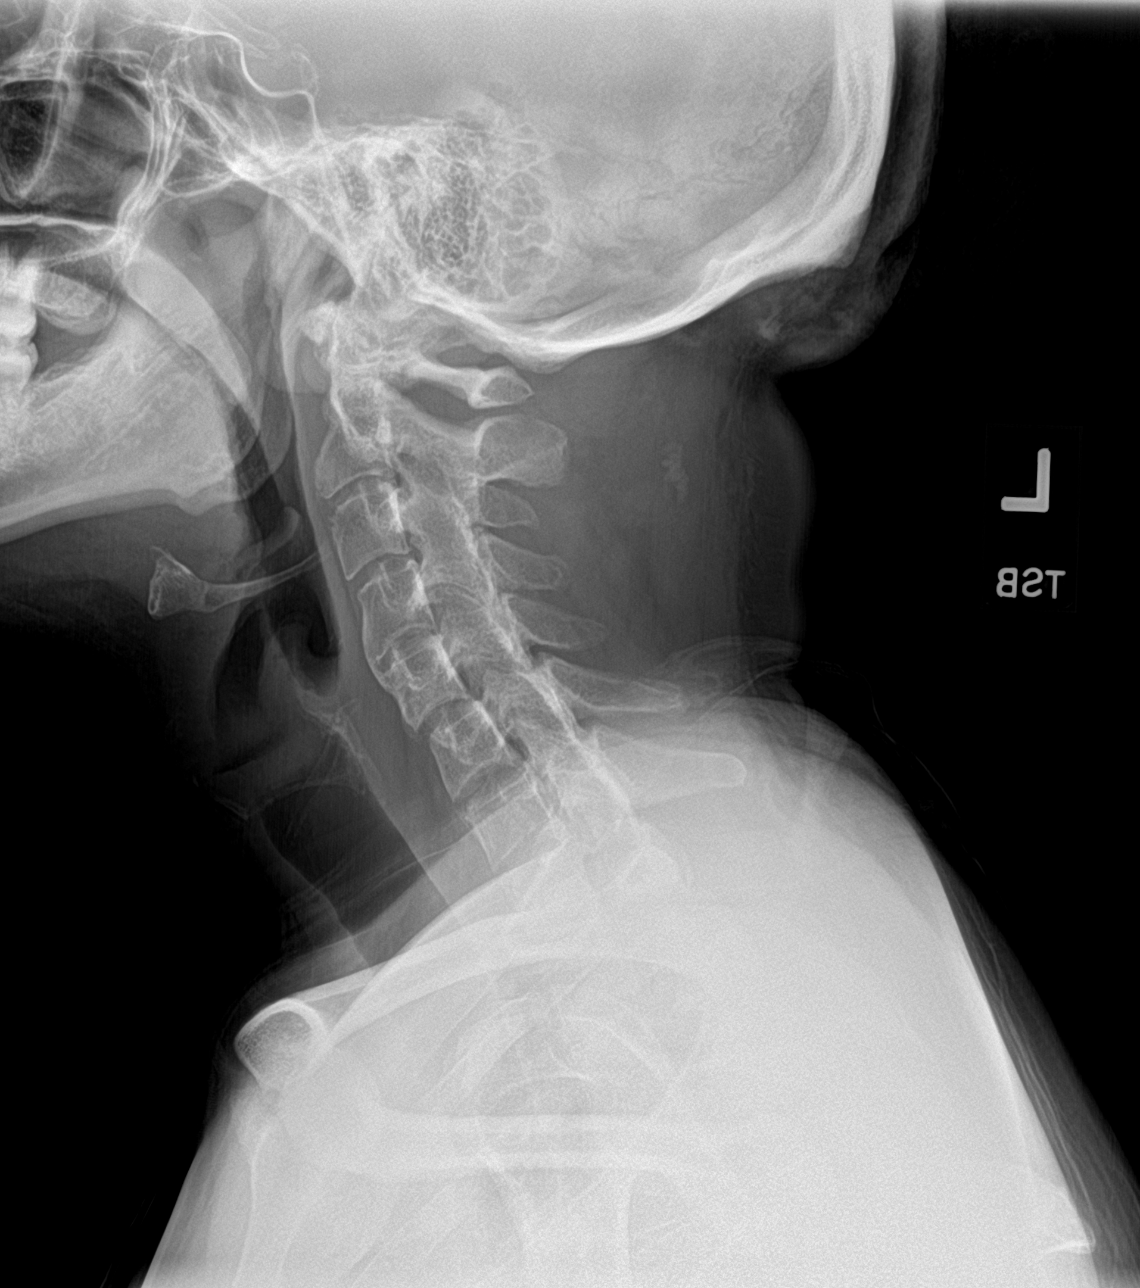

[c-spine ap]
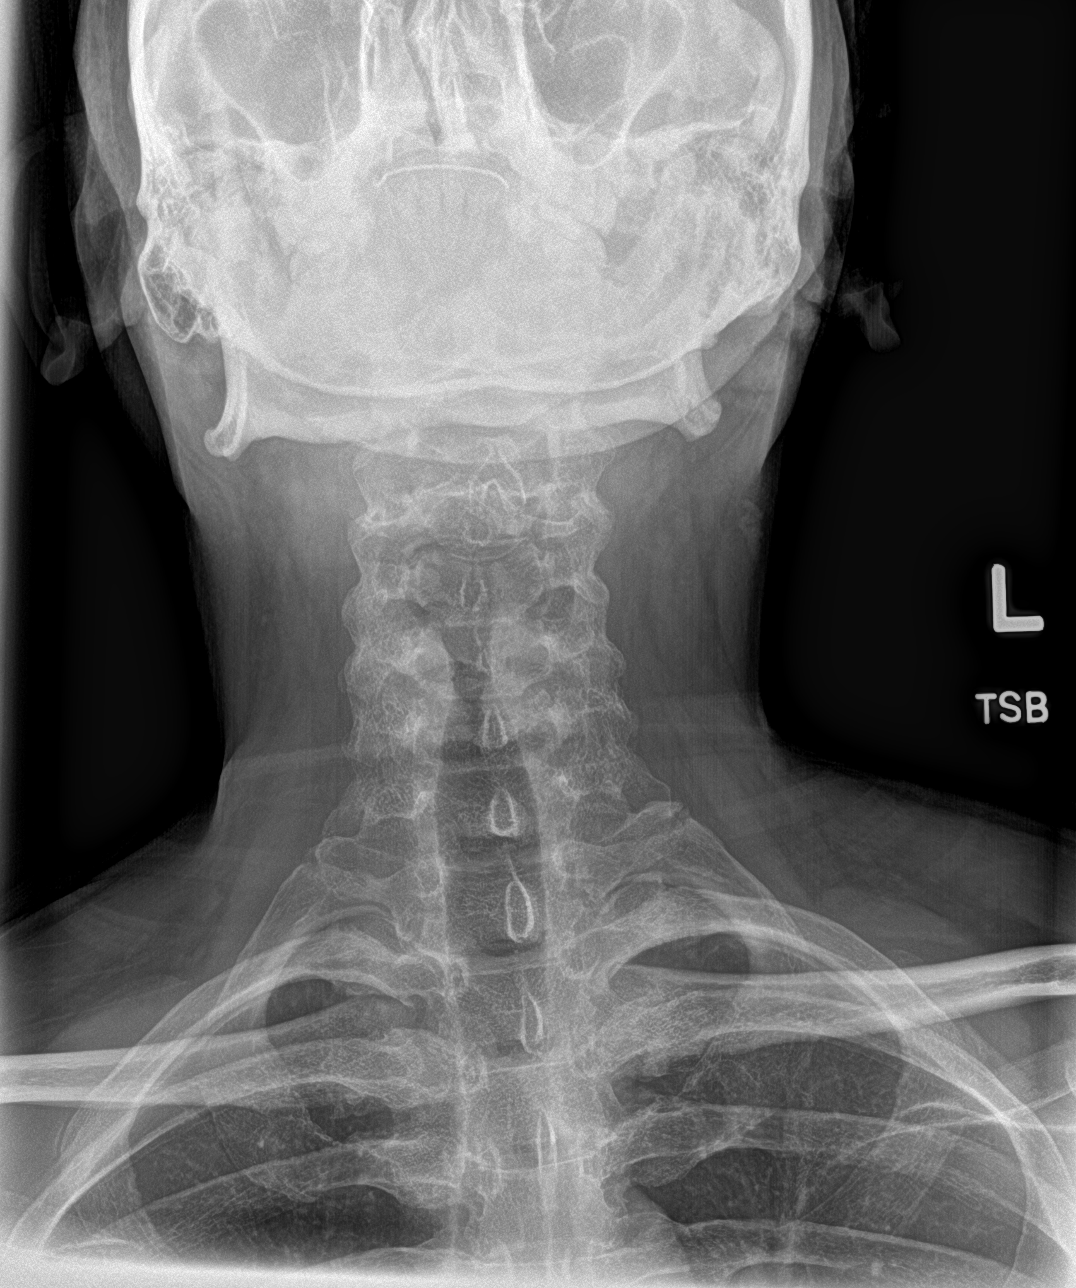

[c-spine open mouth (1 of 2)]
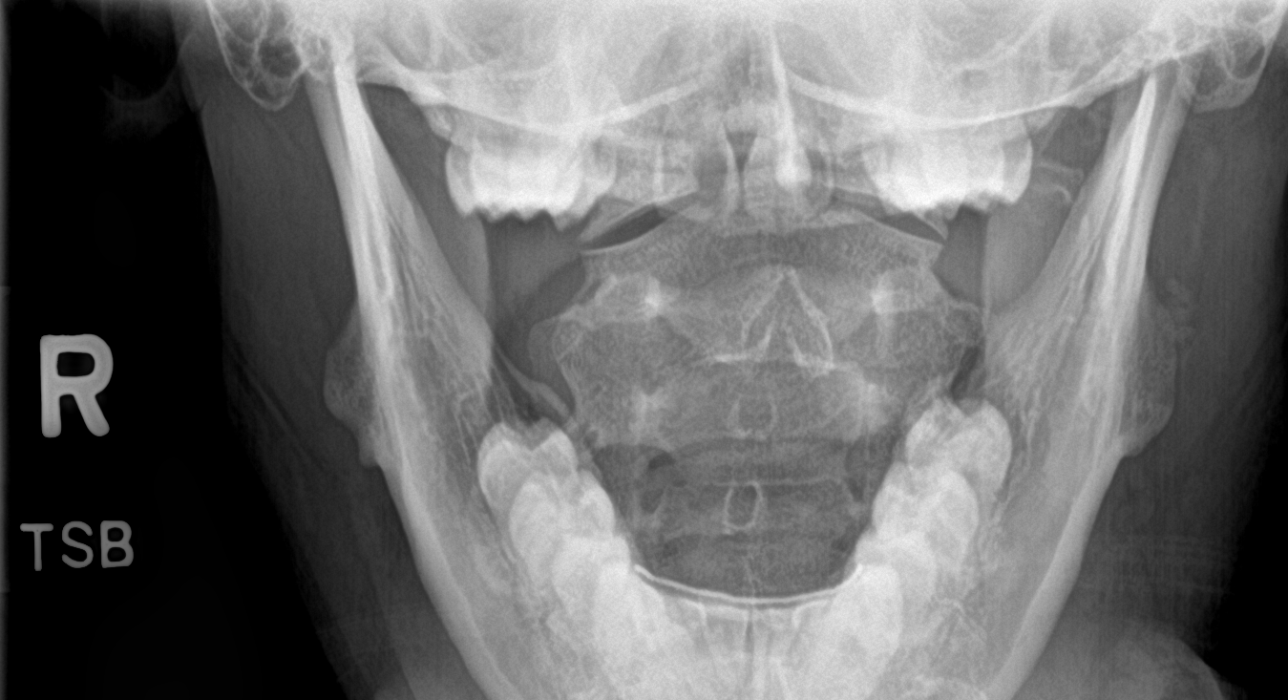

[c-spine open mouth (2 of 2)]
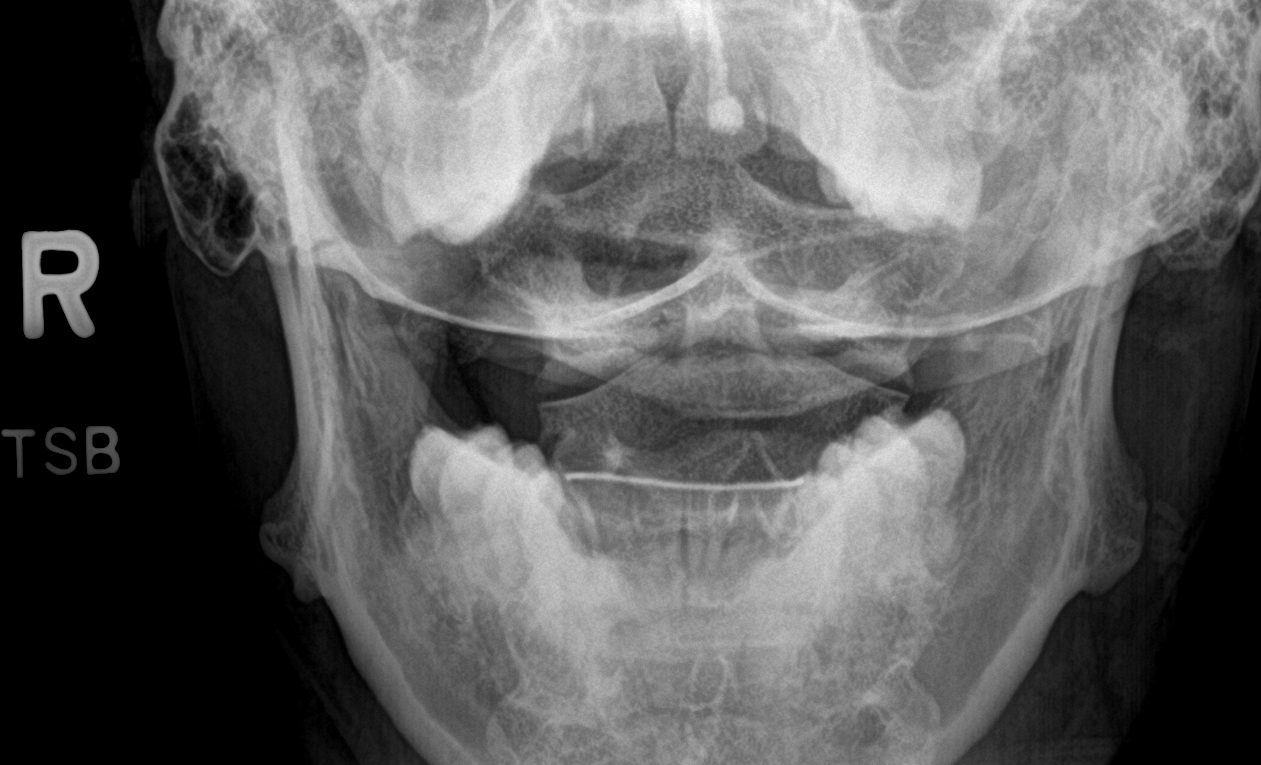

[4 of 4 positions shown; findings below may reference images not displayed]

FINDINGS: Cervical spine:

No evidence of acute fracture or acute traumatic malalignment.
Similar bridging osteophyte at C4-C5 anteriorly with possible fusion
of the disc space. Suggestion of fusion across the posterior
elements at C2-C3 and possibly at other cervical levels.
Prevertebral soft tissues are within normal limits. Slight
anterolisthesis of C5 on C6, similar to prior.

Thoracic spine:

Mild height loss of multiple lower thoracic vertebral bodies, which
appears new since 8444. No clear sclerosis. Normal thoracic
alignment. Disc heights appear largely maintained in thoracic spine
IMPRESSION: Thoracic spine:

1. Mild height loss of multiple lower thoracic vertebral bodies, new
since 8444 and concerning for interval compression fractures. Given
patient's reported acute thoracic back pain, recommend CT of the
thoracic spine to further characterize.
2. Normal thoracic alignment.

Cervical spine:

1. No evidence of acute fracture or traumatic malalignment.
2. Similar bridging osteophyte at C4-C5 with suspected osseous
fusion as detailed above.
3. Similar slight anterolisthesis of C5 on C6.

These results will be called to the ordering clinician or
representative by the Radiologist Assistant, and communication
documented in the PACS or [REDACTED].

## 2022-05-10 ENCOUNTER — Encounter: Payer: Self-pay | Admitting: *Deleted
